# Patient Record
Sex: Female | Born: 1970 | Race: White | Hispanic: Yes | Marital: Married
Health system: Southern US, Community
[De-identification: ages and names within clinical notes are randomized; demographics above are authoritative.]

---

## 1999-02-03 ENCOUNTER — Other Ambulatory Visit: Admission: RE | Admit: 1999-02-03 | Discharge: 1999-02-03 | Payer: Self-pay | Admitting: Gynecology

## 1999-10-10 ENCOUNTER — Other Ambulatory Visit: Admission: RE | Admit: 1999-10-10 | Discharge: 1999-10-10 | Payer: Self-pay | Admitting: Obstetrics and Gynecology

## 1999-10-24 ENCOUNTER — Ambulatory Visit (HOSPITAL_COMMUNITY): Admission: RE | Admit: 1999-10-24 | Discharge: 1999-10-24 | Payer: Self-pay | Admitting: Obstetrics and Gynecology

## 1999-10-24 ENCOUNTER — Encounter: Payer: Self-pay | Admitting: Obstetrics and Gynecology

## 1999-12-22 ENCOUNTER — Ambulatory Visit (HOSPITAL_COMMUNITY): Admission: RE | Admit: 1999-12-22 | Discharge: 1999-12-22 | Payer: Self-pay | Admitting: Obstetrics and Gynecology

## 1999-12-22 ENCOUNTER — Encounter: Payer: Self-pay | Admitting: Obstetrics and Gynecology

## 2000-02-01 ENCOUNTER — Other Ambulatory Visit: Admission: RE | Admit: 2000-02-01 | Discharge: 2000-02-01 | Payer: Self-pay | Admitting: *Deleted

## 2000-02-01 ENCOUNTER — Other Ambulatory Visit: Admission: RE | Admit: 2000-02-01 | Discharge: 2000-02-01 | Payer: Self-pay | Admitting: Obstetrics and Gynecology

## 2000-04-06 ENCOUNTER — Inpatient Hospital Stay (HOSPITAL_COMMUNITY): Admission: AD | Admit: 2000-04-06 | Discharge: 2000-04-06 | Payer: Self-pay | Admitting: Obstetrics & Gynecology

## 2000-05-20 ENCOUNTER — Inpatient Hospital Stay (HOSPITAL_COMMUNITY): Admission: AD | Admit: 2000-05-20 | Discharge: 2000-05-20 | Payer: Self-pay | Admitting: Obstetrics and Gynecology

## 2000-08-09 ENCOUNTER — Inpatient Hospital Stay (HOSPITAL_COMMUNITY): Admission: AD | Admit: 2000-08-09 | Discharge: 2000-08-09 | Payer: Self-pay | Admitting: Obstetrics and Gynecology

## 2000-08-23 ENCOUNTER — Inpatient Hospital Stay (HOSPITAL_COMMUNITY): Admission: AD | Admit: 2000-08-23 | Discharge: 2000-08-23 | Payer: Self-pay | Admitting: Obstetrics and Gynecology

## 2000-08-27 ENCOUNTER — Inpatient Hospital Stay (HOSPITAL_COMMUNITY): Admission: AD | Admit: 2000-08-27 | Discharge: 2000-08-30 | Payer: Self-pay | Admitting: Obstetrics & Gynecology

## 2000-09-01 ENCOUNTER — Encounter: Admission: RE | Admit: 2000-09-01 | Discharge: 2000-10-01 | Payer: Self-pay | Admitting: Obstetrics and Gynecology

## 2000-10-08 ENCOUNTER — Other Ambulatory Visit: Admission: RE | Admit: 2000-10-08 | Discharge: 2000-10-08 | Payer: Self-pay | Admitting: Obstetrics and Gynecology

## 2003-03-01 ENCOUNTER — Emergency Department (HOSPITAL_COMMUNITY): Admission: EM | Admit: 2003-03-01 | Discharge: 2003-03-02 | Payer: Self-pay | Admitting: Emergency Medicine

## 2011-11-28 ENCOUNTER — Ambulatory Visit: Payer: Self-pay

## 2011-11-30 ENCOUNTER — Ambulatory Visit: Payer: Self-pay

## 2012-12-04 ENCOUNTER — Ambulatory Visit: Payer: Self-pay | Admitting: General Practice

## 2013-12-01 ENCOUNTER — Ambulatory Visit: Payer: Self-pay | Admitting: General Practice

## 2013-12-15 ENCOUNTER — Ambulatory Visit: Payer: Self-pay | Admitting: General Practice

## 2014-11-23 ENCOUNTER — Other Ambulatory Visit: Payer: Self-pay | Admitting: Family Medicine

## 2014-11-23 ENCOUNTER — Ambulatory Visit
Admission: RE | Admit: 2014-11-23 | Discharge: 2014-11-23 | Disposition: A | Discharge status: Home / Self Care | Payer: No Typology Code available for payment source | Source: Ambulatory Visit | Attending: Family Medicine | Admitting: Family Medicine

## 2014-11-23 DIAGNOSIS — Z1231 Encounter for screening mammogram for malignant neoplasm of breast: Secondary | ICD-10-CM | POA: Insufficient documentation

## 2015-06-24 ENCOUNTER — Other Ambulatory Visit: Payer: Self-pay | Admitting: Family Medicine

## 2015-06-25 LAB — CMP12+LP+TP+TSH+6AC+CBC/D/PLT
ALT: 21 IU/L (ref 0–32)
AST: 28 IU/L (ref 0–40)
Albumin/Globulin Ratio: 1.5 (ref 1.2–2.2)
Albumin: 4.3 g/dL (ref 3.5–5.5)
Alkaline Phosphatase: 76 IU/L (ref 39–117)
BUN/Creatinine Ratio: 15 (ref 9–23)
BUN: 13 mg/dL (ref 6–24)
Basophils Absolute: 0.1 10*3/uL (ref 0.0–0.2)
Basos: 2 %
Bilirubin Total: 0.4 mg/dL (ref 0.0–1.2)
Calcium: 9.2 mg/dL (ref 8.7–10.2)
Chloride: 103 mmol/L (ref 96–106)
Chol/HDL Ratio: 3.6 ratio units (ref 0.0–4.4)
Cholesterol, Total: 206 mg/dL — ABNORMAL HIGH (ref 100–199)
Creatinine, Ser: 0.85 mg/dL (ref 0.57–1.00)
EOS (ABSOLUTE): 0.3 10*3/uL (ref 0.0–0.4)
Eos: 7 %
Estimated CHD Risk: 0.6 times avg. (ref 0.0–1.0)
Free Thyroxine Index: 2.5 (ref 1.2–4.9)
GFR calc Af Amer: 96 mL/min/{1.73_m2} (ref >59)
GFR calc non Af Amer: 84 mL/min/{1.73_m2} (ref >59)
GGT: 13 IU/L (ref 0–60)
Globulin, Total: 2.8 g/dL (ref 1.5–4.5)
Glucose: 93 mg/dL (ref 65–99)
HDL: 58 mg/dL (ref >39)
Hematocrit: 36.3 % (ref 34.0–46.6)
Hemoglobin: 11.8 g/dL (ref 11.1–15.9)
Immature Grans (Abs): 0 10*3/uL (ref 0.0–0.1)
Immature Granulocytes: 0 %
Iron: 89 ug/dL (ref 27–159)
LDH: 204 IU/L (ref 119–226)
LDL Calculated: 132 mg/dL — ABNORMAL HIGH (ref 0–99)
Lymphocytes Absolute: 1.7 10*3/uL (ref 0.7–3.1)
Lymphs: 42 %
MCH: 28.9 pg (ref 26.6–33.0)
MCHC: 32.5 g/dL (ref 31.5–35.7)
MCV: 89 fL (ref 79–97)
Monocytes Absolute: 0.4 10*3/uL (ref 0.1–0.9)
Monocytes: 10 %
Neutrophils Absolute: 1.6 10*3/uL (ref 1.4–7.0)
Neutrophils: 39 %
Phosphorus: 3.3 mg/dL (ref 2.5–4.5)
Platelets: 266 10*3/uL (ref 150–379)
Potassium: 4.2 mmol/L (ref 3.5–5.2)
RBC: 4.09 x10E6/uL (ref 3.77–5.28)
RDW: 13.8 % (ref 12.3–15.4)
Sodium: 139 mmol/L (ref 134–144)
T3 Uptake Ratio: 30 % (ref 24–39)
T4, Total: 8.3 ug/dL (ref 4.5–12.0)
TSH: 3.29 u[IU]/mL (ref 0.450–4.500)
Total Protein: 7.1 g/dL (ref 6.0–8.5)
Triglycerides: 82 mg/dL (ref 0–149)
Uric Acid: 3.4 mg/dL (ref 2.5–7.1)
VLDL Cholesterol Cal: 16 mg/dL (ref 5–40)
WBC: 4 10*3/uL (ref 3.4–10.8)

## 2015-06-25 LAB — VITAMIN D 25 HYDROXY (VIT D DEFICIENCY, FRACTURES): Vit D, 25-Hydroxy: 28.3 ng/mL — ABNORMAL LOW (ref 30.0–100.0)

## 2015-06-25 LAB — HGB A1C W/O EAG: Hgb A1c MFr Bld: 5.6 % (ref 4.8–5.6)

## 2015-11-11 ENCOUNTER — Other Ambulatory Visit: Payer: Self-pay | Admitting: Family Medicine

## 2015-11-11 DIAGNOSIS — Z1231 Encounter for screening mammogram for malignant neoplasm of breast: Secondary | ICD-10-CM

## 2015-11-28 ENCOUNTER — Ambulatory Visit
Admission: RE | Admit: 2015-11-28 | Discharge: 2015-11-28 | Disposition: A | Discharge status: Home / Self Care | Payer: No Typology Code available for payment source | Source: Ambulatory Visit | Attending: Family Medicine | Admitting: Family Medicine

## 2015-11-28 DIAGNOSIS — Z1231 Encounter for screening mammogram for malignant neoplasm of breast: Secondary | ICD-10-CM | POA: Insufficient documentation

## 2016-06-05 ENCOUNTER — Ambulatory Visit: Payer: Self-pay | Admitting: Registered Nurse

## 2016-06-05 VITALS — BP 105/74 | HR 77 | Temp 98.6°F

## 2016-06-05 DIAGNOSIS — J45909 Unspecified asthma, uncomplicated: Secondary | ICD-10-CM

## 2016-06-05 DIAGNOSIS — H6593 Unspecified nonsuppurative otitis media, bilateral: Secondary | ICD-10-CM

## 2016-06-05 DIAGNOSIS — J209 Acute bronchitis, unspecified: Secondary | ICD-10-CM

## 2016-06-05 MED ORDER — FLUTICASONE PROPIONATE 50 MCG/ACT NA SUSP: 1.0000 | Freq: Two times a day (BID) | NASAL | 0 refills | Status: DC

## 2016-06-05 MED ORDER — LORATADINE 10 MG PO TABS: 10.0000 mg | ORAL_TABLET | Freq: Every day | ORAL | 11 refills | Status: DC

## 2016-06-05 MED ORDER — ALBUTEROL SULFATE HFA 108 (90 BASE) MCG/ACT IN AERS: 1.0000 | INHALATION_SPRAY | RESPIRATORY_TRACT | 0 refills | Status: DC | PRN

## 2016-06-05 MED ORDER — MONTELUKAST SODIUM 10 MG PO TABS: 10.0000 mg | ORAL_TABLET | Freq: Every day | ORAL | 3 refills | Status: DC

## 2016-06-05 MED ORDER — SALINE SPRAY 0.65 % NA SOLN: 2.0000 | NASAL | 0 refills | Status: DC

## 2016-06-05 NOTE — Progress Notes (Signed)
Subjective:    Patient ID: Valerie Morrison, female    DOB: 1970/12/17, 46 y.o.   MRN: [id number]  45y/o hispanic female reports wheezing at night. Unable to lay flat. No cough. Requests inhaler which she hasn't needed in about 3 years. No issues during the day whether active or not. Doesn't take antihistamine or nasal steroids "I usually tough it out".  Didn't need inhaler last year.  Ran out needs refill.  Denied history of asthma as child.  Seasonal allergies started in adulthood per patient.   Last albuterol Rx 06/04/2013 per paper clinic chart review from PA Ron along with claritin and prednisone.  I don't feel like I need steroids now.  Denied fever/chills/sweats.  Has been showering before bedtime and not sleeping with windows open      Review of Systems  Constitutional: Negative for activity change, appetite change, chills, diaphoresis, fatigue, fever and unexpected weight change.  HENT: Positive for postnasal drip. Negative for congestion, dental problem, drooling, ear discharge, ear pain, facial swelling, hearing loss, mouth sores, nosebleeds, rhinorrhea, sinus pain, sinus pressure, sneezing, sore throat, tinnitus, trouble swallowing and voice change.   Eyes: Negative for photophobia, pain, discharge, redness, itching and visual disturbance.  Respiratory: Positive for cough and wheezing. Negative for choking, chest tightness, shortness of breath and stridor.   Cardiovascular: Negative for chest pain, palpitations and leg swelling.  Gastrointestinal: Negative for abdominal distention, abdominal pain, blood in stool, constipation, diarrhea, nausea and vomiting.  Endocrine: Negative for cold intolerance and heat intolerance.  Genitourinary: Negative for difficulty urinating, dysuria and hematuria.  Musculoskeletal: Negative for arthralgias, back pain, gait problem, joint swelling, myalgias, neck pain and neck stiffness.  Skin: Negative for color change, pallor, rash and wound.   Allergic/Immunologic: Positive for environmental allergies. Negative for food allergies.  Neurological: Positive for headaches. Negative for dizziness, tremors, seizures, syncope, facial asymmetry, speech difficulty, weakness, light-headedness and numbness.  Hematological: Negative for adenopathy. Does not bruise/bleed easily.  Psychiatric/Behavioral: Negative for agitation, behavioral problems, confusion and sleep disturbance.       Objective:   Physical Exam  Constitutional: She is oriented to person, place, and time. Vital signs are normal. She appears well-developed and well-nourished. She is active and cooperative.  Non-toxic appearance. She does not have a sickly appearance. She does not appear ill. No distress.  HENT:  Head: Normocephalic and atraumatic.  Right Ear: Hearing, external ear and ear canal normal. A middle ear effusion is present.  Left Ear: Hearing, external ear and ear canal normal. A middle ear effusion is present.  Nose: Mucosal edema and rhinorrhea present. No nose lacerations, sinus tenderness, nasal deformity, septal deviation or nasal septal hematoma. No epistaxis.  No foreign bodies. Right sinus exhibits no maxillary sinus tenderness and no frontal sinus tenderness. Left sinus exhibits no maxillary sinus tenderness and no frontal sinus tenderness.  Mouth/Throat: Uvula is midline and mucous membranes are normal. Mucous membranes are not pale, not dry and not cyanotic. She does not have dentures. No oral lesions. No trismus in the jaw. Normal dentition. No dental abscesses, uvula swelling, lacerations or dental caries. Posterior oropharyngeal edema and posterior oropharyngeal erythema present. No oropharyngeal exudate or tonsillar abscesses.  Bilateral allergic shiners; bilateral TMs air fluid level clear; bilateral nasal turbinates edema/erythema clear discharge; cobblestoning posterior pharynx  Eyes: Conjunctivae, EOM and lids are normal. Pupils are equal, round, and  reactive to light. Right eye exhibits no chemosis, no discharge, no exudate and no hordeolum. No foreign body present in  the right eye. Left eye exhibits no chemosis, no discharge, no exudate and no hordeolum. No foreign body present in the left eye. Right conjunctiva is not injected. Right conjunctiva has no hemorrhage. Left conjunctiva is not injected. Left conjunctiva has no hemorrhage. No scleral icterus. Right eye exhibits normal extraocular motion and no nystagmus. Left eye exhibits normal extraocular motion and no nystagmus. Right pupil is round and reactive. Left pupil is round and reactive. Pupils are equal.  Neck: Trachea normal and normal range of motion. Neck supple. No tracheal tenderness, no spinous process tenderness and no muscular tenderness present. No neck rigidity. No tracheal deviation, no edema, no erythema and normal range of motion present. No thyroid mass and no thyromegaly present.  Cardiovascular: Normal rate, regular rhythm, S1 normal, S2 normal, normal heart sounds and intact distal pulses.  PMI is not displaced.  Exam reveals no gallop and no friction rub.   No murmur heard. Pulmonary/Chest: Effort normal. No accessory muscle usage or stridor. No respiratory distress. She has no decreased breath sounds. She has wheezes in the right middle field and the left middle field. She has no rhonchi. She has no rales. She exhibits no tenderness.  Inspiratory intermittent fine wheeze; speaks full sentences without difficulty cough not observed in exam room  Abdominal: Soft. She exhibits no distension.  Musculoskeletal: Normal range of motion. She exhibits no edema or tenderness.       Right shoulder: Normal.       Left shoulder: Normal.       Right hip: Normal.       Left hip: Normal.       Right knee: Normal.       Left knee: Normal.       Cervical back: Normal.       Right hand: Normal.       Left hand: Normal.  Lymphadenopathy:       Head (right side): No submental, no  submandibular, no tonsillar, no preauricular, no posterior auricular and no occipital adenopathy present.       Head (left side): No submental, no submandibular, no tonsillar, no preauricular, no posterior auricular and no occipital adenopathy present.    She has no cervical adenopathy.       Right cervical: No superficial cervical, no deep cervical and no posterior cervical adenopathy present.      Left cervical: No superficial cervical, no deep cervical and no posterior cervical adenopathy present.  Neurological: She is alert and oriented to person, place, and time. She has normal strength. She is not disoriented. She displays no atrophy and no tremor. No cranial nerve deficit or sensory deficit. She exhibits normal muscle tone. She displays no seizure activity. Coordination and gait normal. GCS eye subscore is 4. GCS verbal subscore is 5. GCS motor subscore is 6.  Skin: Skin is warm, dry and intact. No abrasion, no bruising, no burn, no ecchymosis, no laceration, no lesion, no petechiae and no rash noted. She is not diaphoretic. No cyanosis or erythema. No pallor. Nails show no clubbing.  Psychiatric: She has a normal mood and affect. Her speech is normal and behavior is normal. Judgment and thought content normal. Cognition and memory are normal.  Nursing note and vitals reviewed.         Assessment & Plan:  A-seasonal allergic rhinitis/asthma/bronchitis; bilateral otitis media effusion;   P-wheezing needs inhaler refill.  Did not want oral steroids today will follow up Thursday if no improvement with antihistamine, flonase, singulair and  albuterol. Rx singulair  po qhs#30 RF3.  flonase 1 spray each nostril BID #1 RF0 dispensed from [facility].  Nasal saline 2 sprays each nostril q2h prn congestion #1 RF0 dispensed from clinic stock.  Restart otc claritin  po daily #30 RF3.  Consider follow up with PCM for PFTs/inhaled and/or oral steroids.  Follow up 48 hours if no improvement and/or  worsening of symptoms sooner with PCM/UC/ER provider if difficulty breathing despite albuterol and singulair/antihistamines.  Bronchitis simple, community acquired, may have started as viral (probably respiratory syncytial, parainfluenza, influenza, or adenovirus), but now evidence of acute purulent bronchitis with resultant bronchial edema and mucus formation.  Viruses are the most common cause of bronchial inflammation in otherwise healthy adults with acute bronchitis.  The appearance of sputum is not predictive of whether a bacterial infection is present.  Purulent sputum is most often caused by viral infections.  There are a small portion of those caused by non-viral agents being Mycoplamsa pneumonia.  Microscopic examination or C&S of sputum in the healthy adult with acute bronchitis is generally not helpful (usually negative or normal respiratory flora) other considerations being cough from upper respiratory tract infections, sinusitis or allergic syndromes (mild asthma or viral pneumonia).  Differential Diagnosis:  reactive airway disease (asthma, allergic aspergillosis (eosinophilia), chronic bronchitis, respiratory infection (Sinusitis, Common cold, pneumonia), congestive heart failure, reflux esophagitis, bronchogenic tumor, aspiration syndromes and/or exposure irritants/tobacco smoke.  In this case, there is no evidence of any invasive bacterial illness.  Most likely viral etiology so will hold on antibiotic treatment.  Advise supportive care with rest, encourage fluids, good hygiene and watch for any worsening symptoms.  If they were to develop:  come back to the office or go to the emergency room if after hours.  Without high fever, severe dyspnea, lack of physical findings or other risk factors, I will hold on a chest radiograph and CBC at this time.  I discussed that approximately 50% of patients with acute bronchitis have a cough that lasts up to three weeks, and 25% for over a month.  Tylenol, one  to two tablets every four hours as needed for fever or myalgias.   No aspirin.  Patient instructed to follow up in one week or sooner if symptoms worsen. Patient verbalized agreement and understanding of treatment plan.  P2:  hand washing and cover cough  Patient may use normal saline nasal spray as needed.  Consider antihistamine or nasal steroid use.  Avoid triggers if possible.  Shower prior to bedtime if exposed to triggers.  If allergic dust/dust mites recommend mattress/pillow covers/encasements; washing linens, vacuuming, sweeping, dusting weekly.  Call or return to clinic as needed if these symptoms worsen or fail to improve as anticipated.   Exitcare handout on allergic rhinitis given to patient.  Patient verbalized understanding of instructions, agreed with plan of care and had no further questions at this time.  P2:  Avoidance and hand washing.  Treatment as ordered.  Symptomatic therapy suggested fluids, NSAIDs and rest.  May take Tylenol or Motrin for fevers.  Call or return to clinic as needed if these symptoms worsen or fail to improve as anticipated.   I do not see where any further testing or imaging is necessary at this time.   I will suggest supportive care, rest, good hygiene and encourage the patient to take adequate fluids.  The patient is to return to clinic or EMERGENCY ROOM if symptoms worsen or change significantly e.g. ear pain, fever, purulent discharge from  ears or bleeding.  Exitcare handout on otitis externa given to patient.  Patient verbalized agreement and understanding of treatment plan and had no further questions at this time.

## 2016-06-05 NOTE — Patient Instructions (Addendum)
Refilled albuterol 1-2 puffs by mouth every 4-6 hours for wheezing/chest tightness/protracted coughing Start singulair  by mouth at bedtime daily Start flonase 1 spray each nostril twice a day Shower twice a day and use nasal saline in the shower Do not sleep with windows open Consider wearing mask when outside or exposed to dust at work [employer] claritin or zyrtec  by mouth daily Follow up if worsening wheezing/shortness of breath, coughing up blood, fever greater than 100.5 for immediate re-evaluation clinic/ER/urgent care/PCM Consider having further testing for asthma and/or chest xray if worsening breathing symptoms  Acute Bronchitis, Adult Acute bronchitis is sudden (acute) swelling of the air tubes (bronchi) in the lungs. Acute bronchitis causes these tubes to fill with mucus, which can make it hard to breathe. It can also cause coughing or wheezing. In adults, acute bronchitis usually goes away within 2 weeks. A cough caused by bronchitis may last up to 3 weeks. Smoking, allergies, and asthma can make the condition worse. Repeated episodes of bronchitis may cause further lung problems, such as chronic obstructive pulmonary disease (COPD). What are the causes? This condition can be caused by germs and by substances that irritate the lungs, including:  Cold and flu viruses. This condition is most often caused by the same virus that causes a cold.  Bacteria.  Exposure to tobacco smoke, dust, fumes, and air pollution. What increases the risk? This condition is more likely to develop in people who:  Have close contact with someone with acute bronchitis.  Are exposed to lung irritants, such as tobacco smoke, dust, fumes, and vapors.  Have a weak immune system.  Have a respiratory condition such as asthma. What are the signs or symptoms? Symptoms of this condition include:  A cough.  Coughing up clear, yellow, or green mucus.  Wheezing.  Chest congestion.  Shortness  of breath.  A fever.  Body aches.  Chills.  A sore throat. How is this diagnosed? This condition is usually diagnosed with a physical exam. During the exam, your health care provider may order tests, such as chest X-rays, to rule out other conditions. He or she may also:  Test a sample of your mucus for bacterial infection.  Check the level of oxygen in your blood. This is done to check for pneumonia.  Do a chest X-ray or lung function testing to rule out pneumonia and other conditions.  Perform blood tests. Your health care provider will also ask about your symptoms and medical history. How is this treated? Most cases of acute bronchitis clear up over time without treatment. Your health care provider may recommend:  Drinking more fluids. Drinking more makes your mucus thinner, which may make it easier to breathe.  Taking a medicine for a fever or cough.  Taking an antibiotic medicine.  Using an inhaler to help improve shortness of breath and to control a cough.  Using a cool mist vaporizer or humidifier to make it easier to breathe. Follow these instructions at home: Medicines   Take over-the-counter and prescription medicines only as told by your health care provider.  If you were prescribed an antibiotic, take it as told by your health care provider. Do not stop taking the antibiotic even if you start to feel better. General instructions   Get plenty of rest.  Drink enough fluids to keep your urine clear or pale yellow.  Avoid smoking and secondhand smoke. Exposure to cigarette smoke or irritating chemicals will make bronchitis worse. If you smoke and you need  help quitting, ask your health care provider. Quitting smoking will help your lungs heal faster.  Use an inhaler, cool mist vaporizer, or humidifier as told by your health care provider.  Keep all follow-up visits as told by your health care provider. This is important. How is this prevented? To lower your  risk of getting this condition again:  Wash your hands often with soap and water. If soap and water are not available, use hand sanitizer.  Avoid contact with people who have cold symptoms.  Try not to touch your hands to your mouth, nose, or eyes.  Make sure to get the flu shot every year. Contact a health care provider if:  Your symptoms do not improve in 2 weeks of treatment. Get help right away if:  You cough up blood.  You have chest pain.  You have severe shortness of breath.  You become dehydrated.  You faint or keep feeling like you are going to faint.  You keep vomiting.  You have a severe headache.  Your fever or chills gets worse. This information is not intended to replace advice given to you by your health care provider. Make sure you discuss any questions you have with your health care provider. Document Released: 03/01/2004 Document Revised: 08/17/2015 Document Reviewed: 07/13/2015 Elsevier Interactive Patient Education  2017 [employer] Allergic Rhinitis Allergic rhinitis is when the mucous membranes in the nose respond to allergens. Allergens are particles in the air that cause your body to have an allergic reaction. This causes you to release allergic antibodies. Through a chain of events, these eventually cause you to release histamine into the blood stream. Although meant to protect the body, it is this release of histamine that causes your discomfort, such as frequent sneezing, congestion, and an itchy, runny nose. What are the causes? Seasonal allergic rhinitis (hay fever) is caused by pollen allergens that may come from grasses, trees, and weeds. Year-round allergic rhinitis (perennial allergic rhinitis) is caused by allergens such as house dust mites, pet dander, and mold spores. What are the signs or symptoms?  Nasal stuffiness (congestion).  Itchy, runny nose with sneezing and tearing of the eyes. How is this diagnosed? Your health care provider  can help you determine the allergen or allergens that trigger your symptoms. If you and your health care provider are unable to determine the allergen, skin or blood testing may be used. Your health care provider will diagnose your condition after taking your health history and performing a physical exam. Your health care provider may assess you for other related conditions, such as asthma, pink eye, or an ear infection. How is this treated? Allergic rhinitis does not have a cure, but it can be controlled by:  Medicines that block allergy symptoms. These may include allergy shots, nasal sprays, and oral antihistamines.  Avoiding the allergen. Hay fever may often be treated with antihistamines in pill or nasal spray forms. Antihistamines block the effects of histamine. There are over-the-counter medicines that may help with nasal congestion and swelling around the eyes. Check with your health care provider before taking or giving this medicine. If avoiding the allergen or the medicine prescribed do not work, there are many new medicines your health care provider can prescribe. Stronger medicine may be used if initial measures are ineffective. Desensitizing injections can be used if medicine and avoidance does not work. Desensitization is when a patient is given ongoing shots until the body becomes less sensitive to the allergen. Make sure you follow  up with your health care provider if problems continue. Follow these instructions at home: It is not possible to completely avoid allergens, but you can reduce your symptoms by taking steps to limit your exposure to them. It helps to know exactly what you are allergic to so that you can avoid your specific triggers. Contact a health care provider if:  You have a fever.  You develop a cough that does not stop easily (persistent).  You have shortness of breath.  You start wheezing.  Symptoms interfere with normal daily activities. This information is  not intended to replace advice given to you by your health care provider. Make sure you discuss any questions you have with your health care provider. Document Released: 10/17/2000 Document Revised: 09/23/2015 Document Reviewed: 09/29/2012 Elsevier Interactive Patient Education  2017 [employer] How to Use a Metered Dose Inhaler A metered dose inhaler is a handheld device for taking medicine that must be breathed into the lungs (inhaled). The device can be used to deliver a variety of inhaled medicines, including:  Quick relief or rescue medicines, such as bronchodilators.  Controller medicines, such as corticosteroids. The medicine is delivered by pushing down on a metal canister to release a preset amount of spray and medicine. Each device contains the amount of medicine that is needed for a preset number of uses (inhalations). Your health care provider may recommend that you use a spacer with your inhaler to help you take the medicine more effectively. A spacer is a plastic tube with a mouthpiece on one end and an opening that connects to the inhaler on the other end. A spacer holds the medicine in a tube for a short time, which allows you to inhale more medicine. What are the risks? If you do not use your inhaler correctly, medicine might not reach your lungs to help you breathe. Inhaler medicine can cause side effects, such as:  Mouth or throat infection.  Cough.  Hoarseness.  Headache.  Nausea and vomiting.  Lung infection (pneumonia) in people who have a lung condition called COPD. How to use a metered dose inhaler without a spacer 1. Remove the cap from the inhaler. 2. If you are using the inhaler for the first time, shake it for 5 seconds, turn it away from your face, then release 4 puffs into the air. This is called priming. 3. Shake the inhaler for 5 seconds. 4. Position the inhaler so the top of the canister faces up. 5. Put your index finger on the top of the medicine  canister. Support the bottom of the inhaler with your thumb. 6. Breathe out normally and as completely as possible, away from the inhaler. 7. Either place the inhaler between your teeth and close your lips tightly around the mouthpiece, or hold the inhaler 1-2 inches (2.5-5 cm) away from your open mouth. Keep your tongue down out of the way. If you are unsure which technique to use, ask your health care provider. 8. Press the canister down with your index finger to release the medicine, then inhale deeply and slowly through your mouth (not your nose) until your lungs are completely filled. Inhaling should take 4-6 seconds. 9. Hold the medicine in your lungs for 5-10 seconds (10 seconds is best). This helps the medicine get into the small airways of your lungs. 10. With your lips in a tight circle (pursed), breathe out slowly. 11. Repeat steps 3-10 until you have taken the number of puffs that your health care provider directed.  Wait about 1 minute between puffs or as directed. 12. Put the cap on the inhaler. 13. If you are using a steroid inhaler, rinse your mouth with water, gargle, and spit out the water. Do not swallow the water. How to use a metered dose inhaler with a spacer 1. Remove the cap from the inhaler. 2. If you are using the inhaler for the first time, shake it for 5 seconds, turn it away from your face, then release 4 puffs into the air. This is called priming. 3. Shake the inhaler for 5 seconds. 4. Place the open end of the spacer onto the inhaler mouthpiece. 5. Position the inhaler so the top of the canister faces up and the spacer mouthpiece faces you. 6. Put your index finger on the top of the medicine canister. Support the bottom of the inhaler and the spacer with your thumb. 7. Breathe out normally and as completely as possible, away from the spacer. 8. Place the spacer between your teeth and close your lips tightly around it. Keep your tongue down out of the way. 9. Press the  canister down with your index finger to release the medicine, then inhale deeply and slowly through your mouth (not your nose) until your lungs are completely filled. Inhaling should take 4-6 seconds. 10. Hold the medicine in your lungs for 5-10 seconds (10 seconds is best). This helps the medicine get into the small airways of your lungs. 11. With your lips in a tight circle (pursed), breathe out slowly. 12. Repeat steps 3-11 until you have taken the number of puffs that your health care provider directed. Wait about 1 minute between puffs or as directed. 13. Remove the spacer from the inhaler and put the cap on the inhaler. 14. If you are using a steroid inhaler, rinse your mouth with water, gargle, and spit out the water. Do not swallow the water. Follow these instructions at home:  Take your inhaled medicine only as told by your health care provider. Do not use the inhaler more than directed by your health care provider.  Keep all follow-up visits as told by your health care provider. This is important.  If your inhaler has a counter, you can check it to determine how full your inhaler is. If your inhaler does not have a counter, ask your health care provider when you will need to refill your inhaler and write the refill date on a calendar or on your inhaler canister. Note that you cannot know when an inhaler is empty by shaking it.  Follow directions on the package insert for care and cleaning of your inhaler and spacer. Contact a health care provider if:  Symptoms are only partially relieved with your inhaler.  You are having trouble using your inhaler.  You have an increase in phlegm.  You have headaches. Get help right away if:  You feel little or no relief after using your inhaler.  You have dizziness.  You have a fast heart rate.  You have chills or a fever.  You have night sweats.  There is blood in your phlegm. Summary  A metered dose inhaler is a handheld device  for taking medicine that must be breathed into the lungs (inhaled).  The medicine is delivered by pushing down on a metal canister to release a preset amount of spray and medicine.  Each device contains the amount of medicine that is needed for a preset number of uses (inhalations). This information is not intended to replace  advice given to you by your health care provider. Make sure you discuss any questions you have with your health care provider. Document Released: 01/22/2005 Document Revised: 12/13/2015 Document Reviewed: 12/13/2015 Elsevier Interactive Patient Education  2017 [employer] Asthma, Adult Asthma is a recurring condition in which the airways tighten and narrow. Asthma can make it difficult to breathe. It can cause coughing, wheezing, and shortness of breath. Asthma episodes, also called asthma attacks, range from minor to life-threatening. Asthma cannot be cured, but medicines and lifestyle changes can help control it. What are the causes? Asthma is believed to be caused by inherited (genetic) and environmental factors, but its exact cause is unknown. Asthma may be triggered by allergens, lung infections, or irritants in the air. Asthma triggers are different for each person. Common triggers include:  Animal dander.  Dust mites.  Cockroaches.  Pollen from trees or grass.  Mold.  Smoke.  Air pollutants such as dust, household cleaners, hair sprays, aerosol sprays, paint fumes, strong chemicals, or strong odors.  Cold air, weather changes, and winds (which increase molds and pollens in the air).  Strong emotional expressions such as crying or laughing hard.  Stress.  Certain medicines (such as aspirin) or types of drugs (such as beta-blockers).  Sulfites in foods and drinks. Foods and drinks that may contain sulfites include dried fruit, potato chips, and sparkling grape juice.  Infections or inflammatory conditions such as the flu, a cold, or an inflammation  of the nasal membranes (rhinitis).  Gastroesophageal reflux disease (GERD).  Exercise or strenuous activity. What are the signs or symptoms? Symptoms may occur immediately after asthma is triggered or many hours later. Symptoms include:  Wheezing.  Excessive nighttime or early morning coughing.  Frequent or severe coughing with a common cold.  Chest tightness.  Shortness of breath. How is this diagnosed? The diagnosis of asthma is made by a review of your medical history and a physical exam. Tests may also be performed. These may include:  Lung function studies. These tests show how much air you breathe in and out.  Allergy tests.  Imaging tests such as X-rays. How is this treated? Asthma cannot be cured, but it can usually be controlled. Treatment involves identifying and avoiding your asthma triggers. It also involves medicines. There are 2 classes of medicine used for asthma treatment:  Controller medicines. These prevent asthma symptoms from occurring. They are usually taken every day.  Reliever or rescue medicines. These quickly relieve asthma symptoms. They are used as needed and provide short-term relief. Your health care provider will help you create an asthma action plan. An asthma action plan is a written plan for managing and treating your asthma attacks. It includes a list of your asthma triggers and how they may be avoided. It also includes information on when medicines should be taken and when their dosage should be changed. An action plan may also involve the use of a device called a peak flow meter. A peak flow meter measures how well the lungs are working. It helps you monitor your condition. Follow these instructions at home:  Take medicines only as directed by your health care provider. Speak with your health care provider if you have questions about how or when to take the medicines.  Use a peak flow meter as directed by your health care provider. Record and  keep track of readings.  Understand and use the action plan to help minimize or stop an asthma attack without needing to seek medical  care.  Control your home environment in the following ways to help prevent asthma attacks:  Do not smoke. Avoid being exposed to secondhand smoke.  Change your heating and air conditioning filter regularly.  Limit your use of fireplaces and wood stoves.  Get rid of pests (such as roaches and mice) and their droppings.  Throw away plants if you see mold on them.  Clean your floors and dust regularly. Use unscented cleaning products.  Try to have someone else vacuum for you regularly. Stay out of rooms while they are being vacuumed and for a short while afterward. If you vacuum, use a dust mask from a hardware store, a double-layered or microfilter vacuum cleaner bag, or a vacuum cleaner with a HEPA filter.  Replace carpet with wood, tile, or vinyl flooring. Carpet can trap dander and dust.  Use allergy-proof pillows, mattress covers, and box spring covers.  Wash bed sheets and blankets every week in hot water and dry them in a dryer.  Use blankets that are made of polyester or cotton.  Clean bathrooms and kitchens with bleach. If possible, have someone repaint the walls in these rooms with mold-resistant paint. Keep out of the rooms that are being cleaned and painted.  Wash hands frequently. Contact a health care provider if:  You have wheezing, shortness of breath, or a cough even if taking medicine to prevent attacks.  The colored mucus you cough up (sputum) is thicker than usual.  Your sputum changes from clear or white to yellow, green, gray, or bloody.  You have any problems that may be related to the medicines you are taking (such as a rash, itching, swelling, or trouble breathing).  You are using a reliever medicine more than 2-3 times per week.  Your peak flow is still at 50-79% of your personal best after following your action plan  for 1 hour.  You have a fever. Get help right away if:  You seem to be getting worse and are unresponsive to treatment during an asthma attack.  You are short of breath even at rest.  You get short of breath when doing very little physical activity.  You have difficulty eating, drinking, or talking due to asthma symptoms.  You develop chest pain.  You develop a fast heartbeat.  You have a bluish color to your lips or fingernails.  You are light-headed, dizzy, or faint.  Your peak flow is less than 50% of your personal best. This information is not intended to replace advice given to you by your health care provider. Make sure you discuss any questions you have with your health care provider. Document Released: 01/22/2005 Document Revised: 07/06/2015 Document Reviewed: 08/21/2012 Elsevier Interactive Patient Education  2017 [employer].

## 2016-06-14 ENCOUNTER — Ambulatory Visit: Payer: Self-pay | Admitting: *Deleted

## 2016-06-14 VITALS — BP 110/62 | Ht 61.0 in | Wt 142.0 lb

## 2016-06-14 DIAGNOSIS — Z0000 Encounter for general adult medical examination without abnormal findings: Secondary | ICD-10-CM

## 2016-06-14 NOTE — Progress Notes (Signed)
[employer] insurance premium discount evaluation: Labs Drawn. Waist Circumference: 32" Replacements ROI form signed. Tobacco Free Attestation form signed.  Forms placed in paper chart.

## 2016-06-15 LAB — CMP12+LP+TP+TSH+6AC+CBC/D/PLT
ALBUMIN: 4.4 g/dL (ref 3.5–5.5)
ALT: 29 IU/L (ref 0–32)
AST: 28 IU/L (ref 0–40)
Albumin/Globulin Ratio: 1.6 (ref 1.2–2.2)
Alkaline Phosphatase: 85 IU/L (ref 39–117)
BASOS: 1 %
BUN/Creatinine Ratio: 18 (ref 9–23)
BUN: 15 mg/dL (ref 6–24)
Basophils Absolute: 0.1 10*3/uL (ref 0.0–0.2)
Bilirubin Total: 0.3 mg/dL (ref 0.0–1.2)
CHOLESTEROL TOTAL: 190 mg/dL (ref 100–199)
Calcium: 9.1 mg/dL (ref 8.7–10.2)
Chloride: 102 mmol/L (ref 96–106)
Chol/HDL Ratio: 3.2 ratio (ref 0.0–4.4)
Creatinine, Ser: 0.84 mg/dL (ref 0.57–1.00)
EOS (ABSOLUTE): 0.3 10*3/uL (ref 0.0–0.4)
Eos: 6 %
FREE THYROXINE INDEX: 1.6 (ref 1.2–4.9)
GFR calc Af Amer: 98 mL/min/{1.73_m2} (ref >59)
GFR, EST NON AFRICAN AMERICAN: 85 mL/min/{1.73_m2} (ref >59)
GGT: 12 IU/L (ref 0–60)
GLOBULIN, TOTAL: 2.7 g/dL (ref 1.5–4.5)
Glucose: 92 mg/dL (ref 65–99)
HDL: 60 mg/dL (ref >39)
HEMATOCRIT: 35.3 % (ref 34.0–46.6)
Hemoglobin: 11.3 g/dL (ref 11.1–15.9)
IMMATURE GRANULOCYTES: 0 %
IRON: 50 ug/dL (ref 27–159)
Immature Grans (Abs): 0 10*3/uL (ref 0.0–0.1)
LDH: 173 IU/L (ref 119–226)
LDL Calculated: 113 mg/dL — ABNORMAL HIGH (ref 0–99)
LYMPHS ABS: 1 10*3/uL (ref 0.7–3.1)
Lymphs: 20 %
MCH: 27.3 pg (ref 26.6–33.0)
MCHC: 32 g/dL (ref 31.5–35.7)
MCV: 85 fL (ref 79–97)
MONOS ABS: 0.5 10*3/uL (ref 0.1–0.9)
Monocytes: 10 %
NEUTROS PCT: 63 %
Neutrophils Absolute: 3.2 10*3/uL (ref 1.4–7.0)
PLATELETS: 243 10*3/uL (ref 150–379)
POTASSIUM: 4 mmol/L (ref 3.5–5.2)
Phosphorus: 2.5 mg/dL (ref 2.5–4.5)
RBC: 4.14 x10E6/uL (ref 3.77–5.28)
RDW: 14.7 % (ref 12.3–15.4)
SODIUM: 139 mmol/L (ref 134–144)
T3 UPTAKE RATIO: 24 % (ref 24–39)
T4, Total: 6.7 ug/dL (ref 4.5–12.0)
TSH: 2.4 u[IU]/mL (ref 0.450–4.500)
Total Protein: 7.1 g/dL (ref 6.0–8.5)
Triglycerides: 85 mg/dL (ref 0–149)
Uric Acid: 3.3 mg/dL (ref 2.5–7.1)
VLDL Cholesterol Cal: 17 mg/dL (ref 5–40)
WBC: 5.1 10*3/uL (ref 3.4–10.8)

## 2016-06-15 LAB — HGB A1C W/O EAG: HEMOGLOBIN A1C: 5.2 % (ref 4.8–5.6)

## 2016-06-18 NOTE — Progress Notes (Signed)
Reviewed labs with pt. No questions. Copy provided.

## 2019-10-08 ENCOUNTER — Ambulatory Visit: Payer: Self-pay | Admitting: Registered Nurse

## 2019-10-08 ENCOUNTER — Other Ambulatory Visit: Payer: Self-pay

## 2019-10-08 VITALS — BP 100/77 | HR 66 | Temp 98.3°F

## 2019-10-08 DIAGNOSIS — N3001 Acute cystitis with hematuria: Secondary | ICD-10-CM

## 2019-10-08 LAB — POCT URINALYSIS DIPSTICK
Bilirubin, UA: NEGATIVE
Glucose, UA: NEGATIVE mg/dL
Ketones, POC UA: NEGATIVE mg/dL
Nitrite, UA: NEGATIVE
Specific Gravity, UA: 1.015 (ref 1.005–1.030)
Urobilinogen, UA: 0.2 E.U./dL
pH, UA: 6.5 (ref 5.0–8.0)

## 2019-10-08 MED ORDER — SULFAMETHOXAZOLE-TRIMETHOPRIM 800-160 MG PO TABS: 1.0000 | ORAL_TABLET | Freq: Two times a day (BID) | ORAL | 0 refills | Status: AC

## 2019-10-08 MED ORDER — PHENAZOPYRIDINE HCL 200 MG PO TABS: 200.0000 mg | ORAL_TABLET | Freq: Three times a day (TID) | ORAL | 0 refills | Status: DC

## 2019-10-08 NOTE — Patient Instructions (Signed)

## 2019-10-08 NOTE — Progress Notes (Signed)
Subjective:    Patient ID: Valerie Morrison, female    DOB: 1970-07-06, 49 y.o.   MRN: [id number]  49y/o Hispanic established female pt reports she woke up this morning with suprapubic pain and burning with urination. Endorses urinary urgency as well. Cloudy urine. Denies frequency. Reports Hx of UTI in distant past.   Last dipstick urinalysis completed annual physical Jan 2021 was on her menstrual cycle.  Not taking oral contraceptives.  Denied headache, back pain, swelling hands/feet, n/v/d.     Review of Systems  Constitutional: Negative for activity change, appetite change, chills, diaphoresis and fatigue.  HENT: Negative for trouble swallowing and voice change.   Eyes: Negative for photophobia and visual disturbance.  Respiratory: Negative for cough, shortness of breath, wheezing and stridor.   Gastrointestinal: Negative for abdominal pain, diarrhea, nausea and vomiting.  Endocrine: Negative for cold intolerance and heat intolerance.  Genitourinary: Positive for dysuria and urgency. Negative for decreased urine volume, dyspareunia, enuresis, flank pain, frequency, hematuria and menstrual problem.  Musculoskeletal: Negative for arthralgias, back pain, gait problem, joint swelling, myalgias, neck pain and neck stiffness.  Skin: Negative for rash.  Allergic/Immunologic: Negative for food allergies.  Neurological: Negative for dizziness, tremors, syncope, weakness, light-headedness and headaches.  Hematological: Negative for adenopathy. Does not bruise/bleed easily.  Psychiatric/Behavioral: Negative for agitation, confusion and sleep disturbance.       Objective:   Physical Exam Constitutional:      General: She is awake. She is not in acute distress.    Appearance: Normal appearance. She is well-developed, well-groomed and normal weight. She is not ill-appearing, toxic-appearing or diaphoretic.  HENT:     Head: Normocephalic and atraumatic.     Jaw: There is normal jaw  occlusion.     Right Ear: Hearing and external ear normal.     Left Ear: Hearing and external ear normal.     Nose: Nose normal. No congestion or rhinorrhea.     Mouth/Throat:     Mouth: No angioedema.     Pharynx: Oropharynx is clear.  Eyes:     General: Lids are normal. Vision grossly intact. Gaze aligned appropriately. No allergic shiner, visual field deficit or scleral icterus.       Right eye: No discharge.        Left eye: No discharge.     Extraocular Movements: Extraocular movements intact.     Conjunctiva/sclera: Conjunctivae normal.     Pupils: Pupils are equal, round, and reactive to light.  Neck:     Trachea: Trachea and phonation normal.  Cardiovascular:     Rate and Rhythm: Normal rate and regular rhythm.     Pulses: Normal pulses.  Pulmonary:     Effort: Pulmonary effort is normal. No respiratory distress.     Breath sounds: Normal breath sounds and air entry. No stridor or transmitted upper airway sounds. No wheezing.     Comments: No cough observed in exam room; spoke full sentences without difficulty; wearing cloth mask due to covid 19 pandemic Abdominal:     General: Abdomen is flat. Bowel sounds are decreased. There is no distension.     Palpations: Abdomen is soft. There is no shifting dullness, fluid wave, hepatomegaly, splenomegaly, mass or pulsatile mass.     Tenderness: There is no abdominal tenderness. There is no right CVA tenderness, left CVA tenderness, guarding or rebound. Negative signs include Murphy's sign.     Hernia: No hernia is present. There is no hernia in the umbilical area  or ventral area.     Comments: Dull to percussion x 4quads; hypoactive bowel sounds x 4 quads  Musculoskeletal:        General: No swelling, tenderness, deformity or signs of injury. Normal range of motion.     Right shoulder: Normal.     Left shoulder: Normal.     Right elbow: Normal.     Left elbow: Normal.     Right hand: Normal.     Left hand: Normal.     Cervical  back: Normal, normal range of motion and neck supple. No swelling, edema, deformity, erythema, signs of trauma, lacerations, rigidity, tenderness or crepitus. Normal range of motion.     Thoracic back: Normal.     Lumbar back: Normal.     Right lower leg: No edema.     Left lower leg: No edema.  Lymphadenopathy:     Head:     Right side of head: No submental, submandibular, tonsillar, preauricular, posterior auricular or occipital adenopathy.     Left side of head: No submental, submandibular, tonsillar, preauricular, posterior auricular or occipital adenopathy.     Cervical: No cervical adenopathy.     Right cervical: No superficial cervical adenopathy.    Left cervical: No superficial cervical adenopathy.  Skin:    General: Skin is warm and dry.     Capillary Refill: Capillary refill takes less than 2 seconds.     Coloration: Skin is not ashen, cyanotic, jaundiced, mottled, pale or sallow.     Findings: No abrasion, abscess, acne, bruising, burn, ecchymosis, erythema, signs of injury, laceration, lesion, petechiae, rash or wound.     Nails: There is no clubbing.  Neurological:     General: No focal deficit present.     Mental Status: She is alert and oriented to person, place, and time. Mental status is at baseline.     GCS: GCS eye subscore is 4. GCS verbal subscore is 5. GCS motor subscore is 6.     Cranial Nerves: Cranial nerves are intact. No cranial nerve deficit, dysarthria or facial asymmetry.     Sensory: Sensation is intact. No sensory deficit.     Motor: Motor function is intact. No weakness, tremor, atrophy, abnormal muscle tone or seizure activity.     Coordination: Coordination is intact. Coordination normal.     Gait: Gait is intact. Gait normal.     Comments: Gait sure and steady in clinic; on/off exam table and in/out of chair without difficulty; bilateral hand grasp equal bilaterally 5/5; standing to sitting to supine and reverse without difficulty  Psychiatric:         Attention and Perception: Attention and perception normal.        Mood and Affect: Mood and affect normal.        Speech: Speech normal.        Behavior: Behavior normal. Behavior is cooperative.        Thought Content: Thought content normal.        Cognition and Memory: Cognition and memory normal.        Judgment: Judgment normal.     Patient notified urinalysis positive blood, bacteria, leukocyte esterase consistent with UTI.  Patient verbalized understanding information and had no further questions at this time.     Assessment & Plan:  A-UTI with hematuria  P-Medications as directed. Bactrim DS po BID x 3 days #40 RF0 dispensed from [facility] to patient.  Urine culture sent to [facility] typically results in 72 hours  will call if showing resistance to bactrim and discuss further treatment options. Patient is also to push fluids and may use electronic Rx Pyridium 200mg  po TID as needed #6 RF0 discussed urine will be bright gold/orange and can stain clothes. Hydrate, avoid dehydration. Avoid holding urine void on frequent basis every 4 to 6 hours. If unable to void every 8 hours follow up for re-evaluation with PCM, urgent care or ER. Call or return to clinic as needed if these symptoms worsen or fail to improve as anticipated.  Same day re-evaluation if tea/cola colored urine, unable to void every 8 hours, gross hematuria/clots, back pain and/or back pain. Exitcare handout on UTI printed and given to patient.  Patient verbalized agreement and understanding of treatment plan and had no further questions at this time.  P2: Hydrate and cranberry juice

## 2020-06-10 ENCOUNTER — Other Ambulatory Visit: Payer: Self-pay

## 2020-06-10 ENCOUNTER — Ambulatory Visit: Payer: Self-pay | Admitting: *Deleted

## 2020-06-10 VITALS — BP 120/75 | HR 65 | Ht 61.0 in | Wt 134.0 lb

## 2020-06-10 DIAGNOSIS — Z0000 Encounter for general adult medical examination without abnormal findings: Secondary | ICD-10-CM

## 2020-06-10 NOTE — Progress Notes (Signed)
[employer] insurance premium discount evaluation: Labs Drawn. Repl[employer] ROI form signed. Tobacco Free Attestation form signed.  Forms placed in paper chart. Okay to route results to pcp per pt. 

## 2020-06-11 ENCOUNTER — Encounter: Payer: Self-pay | Admitting: Registered Nurse

## 2020-06-11 LAB — CMP12+LP+TP+TSH+6AC+CBC/D/PLT
ALT: 46 IU/L — ABNORMAL HIGH (ref 0–32)
AST: 29 IU/L (ref 0–40)
Albumin/Globulin Ratio: 1.7 (ref 1.2–2.2)
Albumin: 4.5 g/dL (ref 3.8–4.8)
Alkaline Phosphatase: 123 IU/L — ABNORMAL HIGH (ref 44–121)
BUN/Creatinine Ratio: 17 (ref 9–23)
BUN: 13 mg/dL (ref 6–24)
Basophils Absolute: 0.1 10*3/uL (ref 0.0–0.2)
Basos: 2 %
Bilirubin Total: 0.3 mg/dL (ref 0.0–1.2)
Calcium: 9.9 mg/dL (ref 8.7–10.2)
Chloride: 102 mmol/L (ref 96–106)
Chol/HDL Ratio: 3.7 ratio (ref 0.0–4.4)
Cholesterol, Total: 179 mg/dL (ref 100–199)
Creatinine, Ser: 0.76 mg/dL (ref 0.57–1.00)
EOS (ABSOLUTE): 0.5 10*3/uL — ABNORMAL HIGH (ref 0.0–0.4)
Eos: 11 %
Estimated CHD Risk: 0.7 times avg. (ref 0.0–1.0)
Free Thyroxine Index: 4.4 (ref 1.2–4.9)
GGT: 26 IU/L (ref 0–60)
Globulin, Total: 2.7 g/dL (ref 1.5–4.5)
Glucose: 100 mg/dL — ABNORMAL HIGH (ref 65–99)
HDL: 49 mg/dL (ref >39)
Hematocrit: 43.1 % (ref 34.0–46.6)
Hemoglobin: 14.2 g/dL (ref 11.1–15.9)
Immature Grans (Abs): 0 10*3/uL (ref 0.0–0.1)
Immature Granulocytes: 0 %
Iron: 87 ug/dL (ref 27–159)
LDH: 210 IU/L (ref 119–226)
LDL Chol Calc (NIH): 115 mg/dL — ABNORMAL HIGH (ref 0–99)
Lymphocytes Absolute: 2.3 10*3/uL (ref 0.7–3.1)
Lymphs: 51 %
MCH: 28.5 pg (ref 26.6–33.0)
MCHC: 32.9 g/dL (ref 31.5–35.7)
MCV: 87 fL (ref 79–97)
Monocytes Absolute: 0.3 10*3/uL (ref 0.1–0.9)
Monocytes: 7 %
Neutrophils Absolute: 1.3 10*3/uL — ABNORMAL LOW (ref 1.4–7.0)
Neutrophils: 29 %
Phosphorus: 3.8 mg/dL (ref 3.0–4.3)
Platelets: 266 10*3/uL (ref 150–450)
Potassium: 4.4 mmol/L (ref 3.5–5.2)
RBC: 4.98 x10E6/uL (ref 3.77–5.28)
RDW: 12.5 % (ref 11.7–15.4)
Sodium: 140 mmol/L (ref 134–144)
T3 Uptake Ratio: 33 % (ref 24–39)
T4, Total: 13.2 ug/dL — ABNORMAL HIGH (ref 4.5–12.0)
TSH: <0.005 u[IU]/mL — ABNORMAL LOW (ref 0.450–4.500)
Total Protein: 7.2 g/dL (ref 6.0–8.5)
Triglycerides: 83 mg/dL (ref 0–149)
Uric Acid: 4 mg/dL (ref 2.6–6.2)
VLDL Cholesterol Cal: 15 mg/dL (ref 5–40)
WBC: 4.4 10*3/uL (ref 3.4–10.8)
eGFR: 97 mL/min/{1.73_m2} (ref >59)

## 2020-06-11 LAB — HGB A1C W/O EAG: Hgb A1c MFr Bld: 5.7 % — ABNORMAL HIGH (ref 4.8–5.6)

## 2020-06-11 LAB — VITAMIN D 25 HYDROXY (VIT D DEFICIENCY, FRACTURES): Vit D, 25-Hydroxy: 17.1 ng/mL — ABNORMAL LOW (ref 30.0–100.0)

## 2020-12-18 ENCOUNTER — Telehealth: Payer: Self-pay | Admitting: Registered Nurse

## 2020-12-18 ENCOUNTER — Encounter: Payer: Self-pay | Admitting: Registered Nurse

## 2020-12-18 DIAGNOSIS — Z20822 Contact with and (suspected) exposure to covid-19: Secondary | ICD-10-CM

## 2020-12-18 NOTE — Telephone Encounter (Signed)
HR Tim notified NP patient reported daughter and spouse with positive covid tests on 12/17/20.  She started quarantining separate from them yesterday after positive tests.  She is asymptomatic. Per HR records patient has received 2 covid vaccines last dose per employer records 17 Jul 2019. Voicemail full sent SMS message for patient to contact NP 12/17/20 at [phone].  Spoke with patient today via telephone.  She sanitized high touch surfaces yesterday.  Still asymptomatic today.  Plans to repeat home covid test Day 5 11/17.  Her initial test negative 12/17/20.  Patient to wear mask when around others x 10 days and no eating in employee lunch room through 11/22.  Monitor for covid symptoms and if they develop stay home and contact clinic staff.  Discussed depending on day of symptom onset may change next home test date.  Reviewed possible Covid symptoms including cough, shortness of breath with exertion or at rest, runny nose, congestion, sinus pain/pressure, sore throat, fever/chills, body aches, fatigue, loss of taste/smell, GI symptoms of nausea/vomiting/diarrhea.   Daughter/spouse to isolate in own room and if possible use only one bathroom if living with others in home.  Wear mask when out of room to help prevent spread to others in household.  Sanitize high touch surfaces with lysol/chlorox/bleach spray or wipes daily as viruses are known to live on surfaces from 24 hours to days.  She stated daughter and spouse just have allergy/cold symptoms and doing well.   No audible cough/nasal congestion/throat clearing during 4 minute telephone call.  Discussed with patient she can contact me at this number [phone] if clinic closed for questions or concerns. RN Rolly Salter returns to clinic on Monday 12/19/20 [phone].   Pt verbalized understanding and agreement with plan of care. No further questions/concerns at this time. Pt reminded to contact clinic with any changes in symptoms or questions/concerns. HR  notified patient to monitor for symptoms x 10 days and retest day 5 after exposure 11/12 along with strict mask wear through Day 10 11/22 and no eating in employee lunch room.

## 2020-12-19 NOTE — Telephone Encounter (Signed)
Patient reported returned to work as expected today and continues asymptomatic.  She is wearing her mask and will notify clinic staff if any symptoms develop.  She will test 11/17 home covid.  Patient A&Ox3 spoke full sentences without difficulty no cough/congestion/throat clearing noted during 2 minute telephone call.  HR notified patient continues asymptomatic and returned to work today.

## 2020-12-22 NOTE — Telephone Encounter (Signed)
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  Agreed with plan of care.

## 2020-12-22 NOTE — Telephone Encounter (Signed)
Pt remains asymptomatic. Negative home covid test 11/17. Strict mask use thru 11/22.

## 2020-12-27 NOTE — Telephone Encounter (Signed)
Day 10 today. Called pt for sx check and close encounter. No answer. LVM advising NP Inetta Fermo would likely f/u by phone tomorrow or this week.

## 2020-12-30 NOTE — Telephone Encounter (Signed)
Patient reported continues asymptomatic and working without difficulty past day 10 encounter closed.  Patient A&Ox3 telephone call respirations even and unlabored no cough/congestion/throat clearing noted during 2 minute call.  Spoke full sentences without difficulty.

## 2021-02-22 ENCOUNTER — Other Ambulatory Visit: Payer: Self-pay | Admitting: Obstetrics and Gynecology

## 2021-02-22 DIAGNOSIS — Z1231 Encounter for screening mammogram for malignant neoplasm of breast: Secondary | ICD-10-CM

## 2021-02-28 ENCOUNTER — Other Ambulatory Visit: Payer: Self-pay

## 2021-02-28 ENCOUNTER — Ambulatory Visit
Admission: RE | Admit: 2021-02-28 | Discharge: 2021-02-28 | Disposition: A | Discharge status: Home / Self Care | Payer: No Typology Code available for payment source | Source: Ambulatory Visit | Attending: Obstetrics and Gynecology | Admitting: Obstetrics and Gynecology

## 2021-02-28 DIAGNOSIS — Z1231 Encounter for screening mammogram for malignant neoplasm of breast: Secondary | ICD-10-CM

## 2022-04-02 ENCOUNTER — Other Ambulatory Visit: Payer: Self-pay | Admitting: Obstetrics and Gynecology

## 2022-04-02 DIAGNOSIS — Z1231 Encounter for screening mammogram for malignant neoplasm of breast: Secondary | ICD-10-CM

## 2022-05-14 ENCOUNTER — Ambulatory Visit
Admission: RE | Admit: 2022-05-14 | Discharge: 2022-05-14 | Disposition: A | Discharge status: Home / Self Care | Payer: No Typology Code available for payment source | Source: Ambulatory Visit

## 2022-05-14 DIAGNOSIS — Z1231 Encounter for screening mammogram for malignant neoplasm of breast: Secondary | ICD-10-CM

## 2022-11-26 IMAGING — MG MM DIGITAL SCREENING BILAT W/ TOMO AND CAD
8 series · 9 of 24 positions shown · non-contrast
Comparison: Previous exam(s).

CLINICAL DATA: Screening.

EXAM:
DIGITAL SCREENING BILATERAL MAMMOGRAM WITH TOMOSYNTHESIS AND CAD
TECHNIQUE: Bilateral screening digital craniocaudal and mediolateral oblique
mammograms were obtained. Bilateral screening digital breast
tomosynthesis was performed. The images were evaluated with
computer-aided detection.

[L MLO synth-2D]
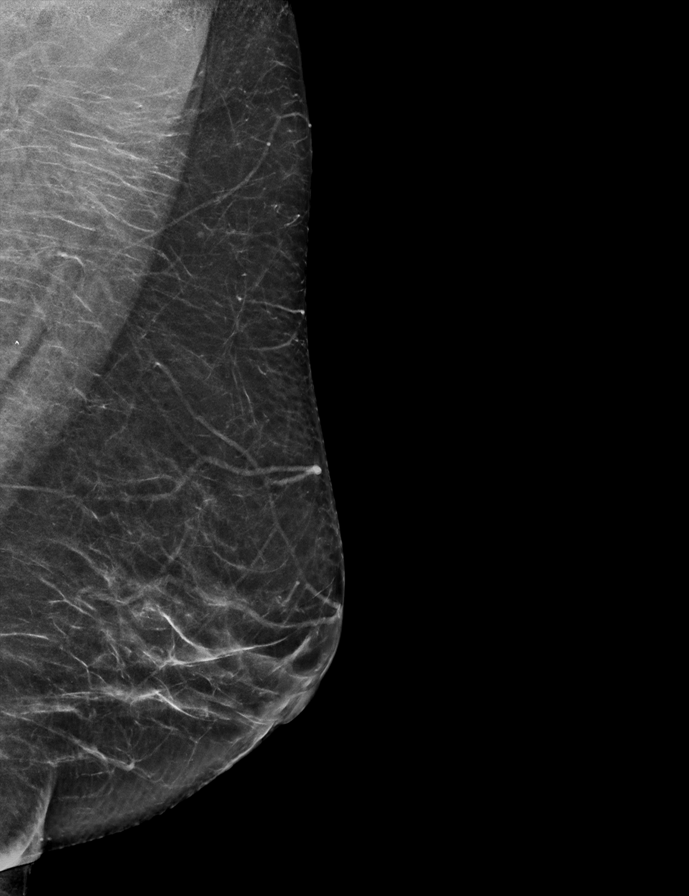

[R CC synth-2D]
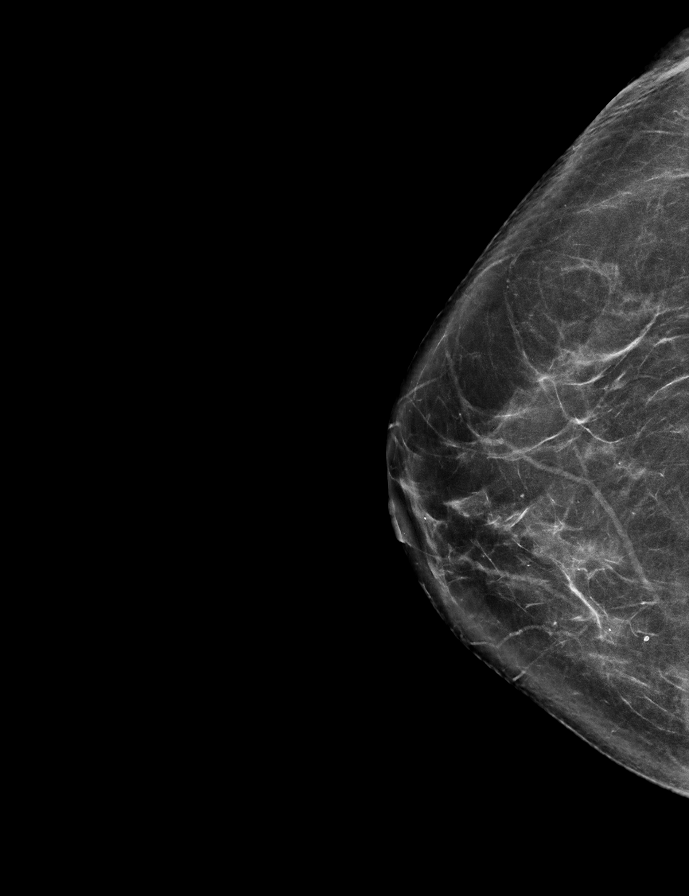

[R MLO synth-2D]
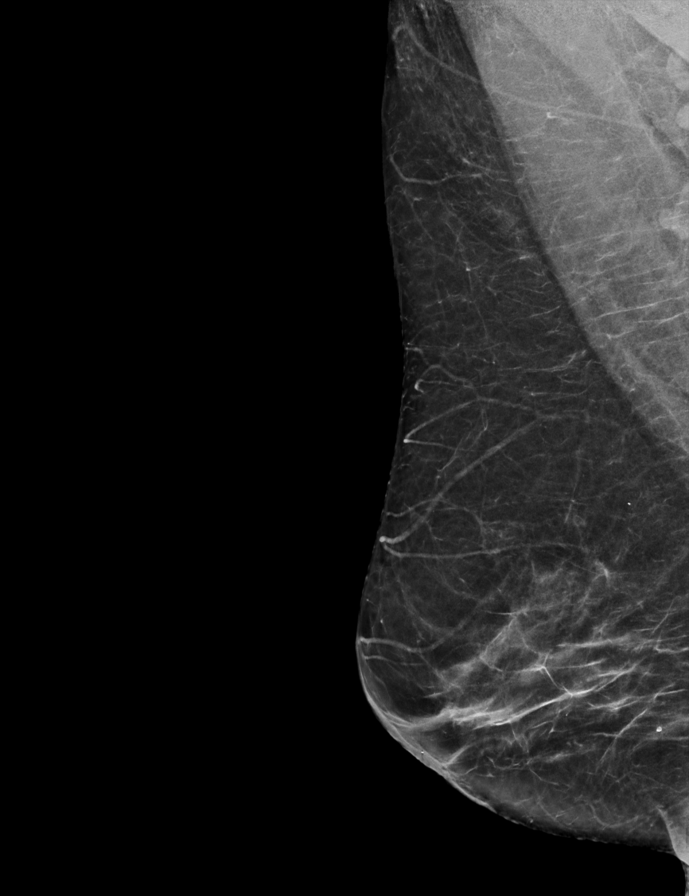

[L CC synth-2D]
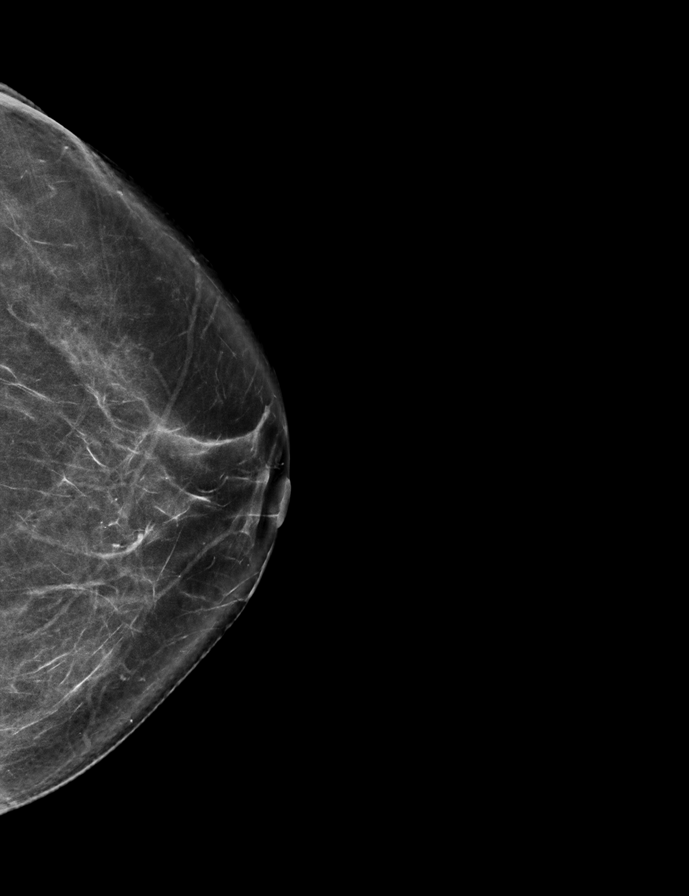

[L CC tomo · 2 of 68 frames shown]
[frame 22/68]
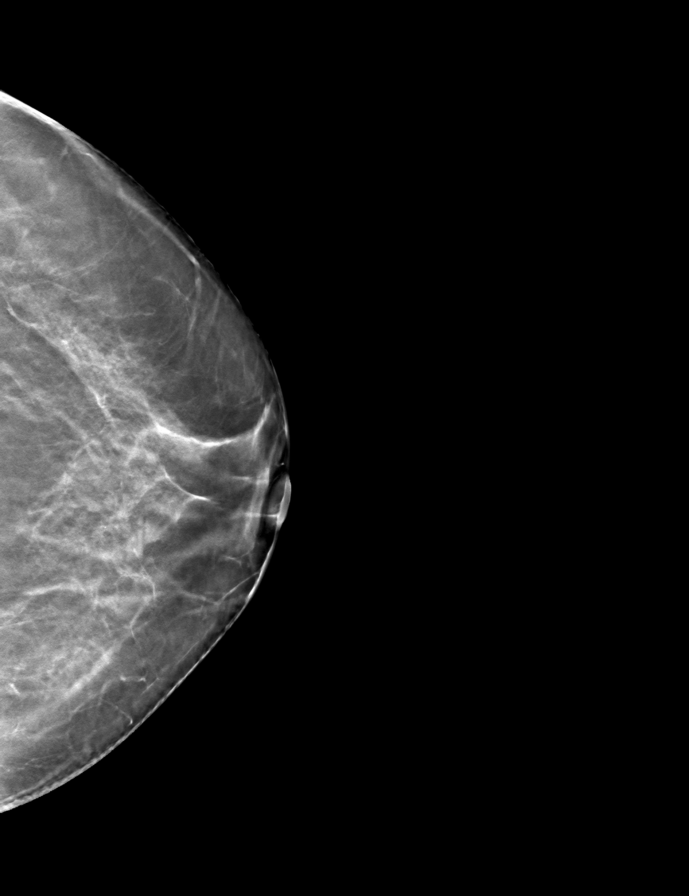
[frame 35/68]
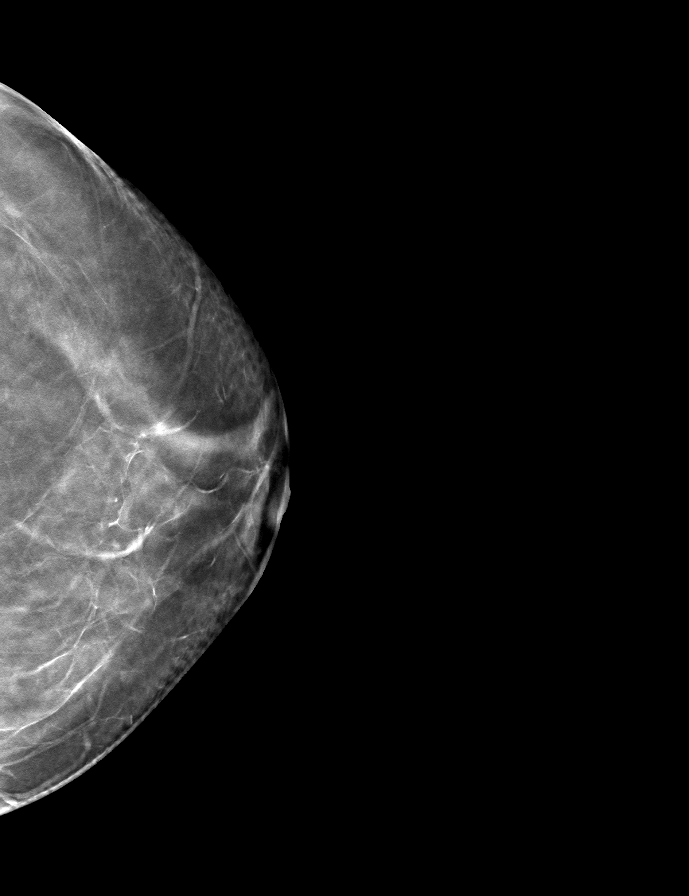

[R CC tomo · tomo slice 35/70.0]
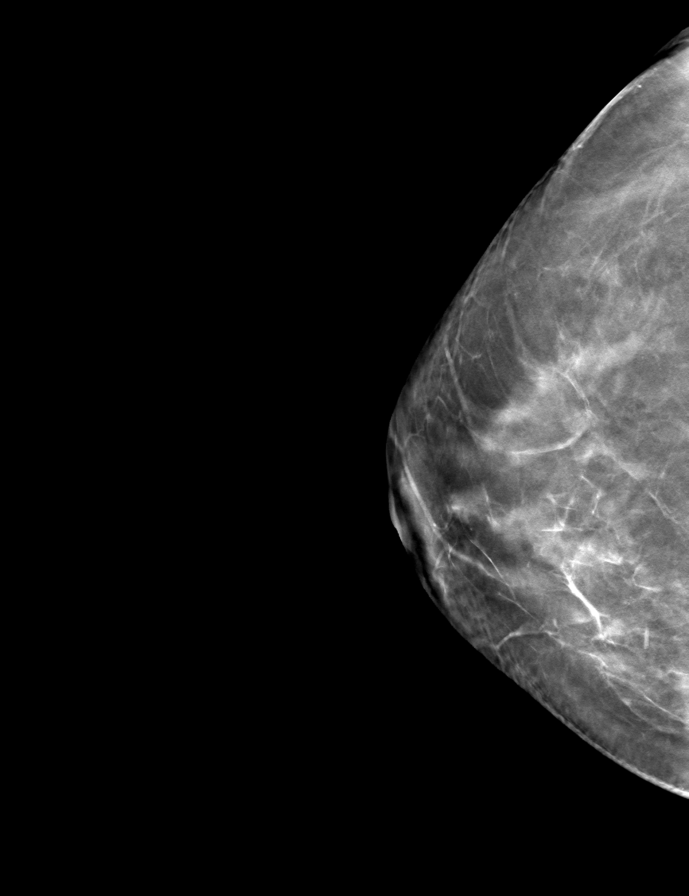

[L MLO tomo · tomo slice 34/67.0]
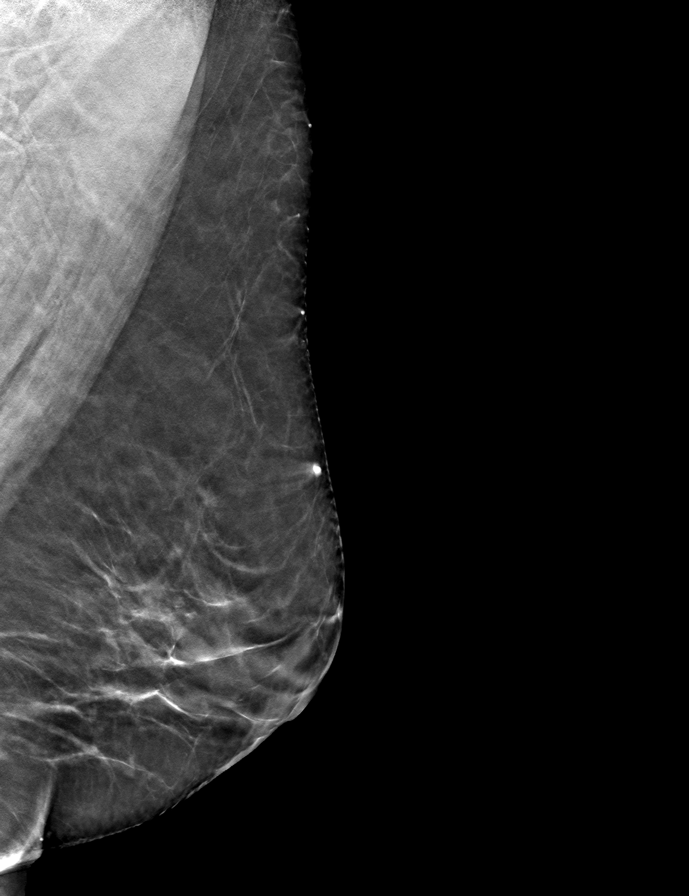

[R MLO tomo · tomo slice 33/66.0]
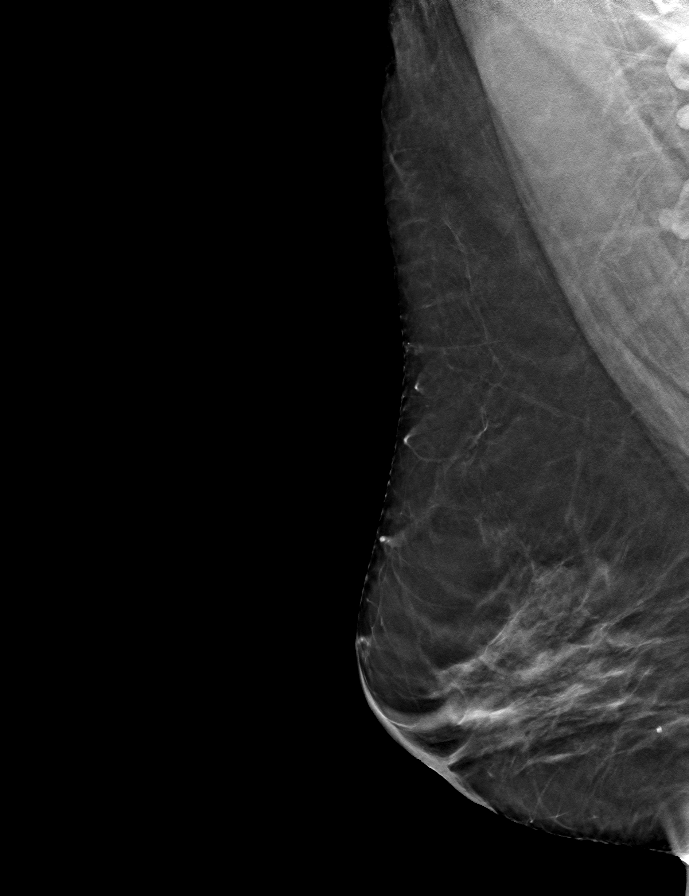

[9 of 24 positions shown; findings below may reference images not displayed]

ACR Breast Density Category b: There are scattered areas of
fibroglandular density.
FINDINGS: There are no findings suspicious for malignancy.
IMPRESSION: No mammographic evidence of malignancy. A result letter of this
screening mammogram will be mailed directly to the patient.

RECOMMENDATION:
Screening mammogram in one year. (Code:51-O-LD2)

BI-RADS CATEGORY  1: Negative.
